# Patient Record
Sex: Male | Born: 2000
Health system: Southern US, Community
[De-identification: ages and names within clinical notes are randomized; demographics above are authoritative.]

## PROBLEM LIST (undated history)

## (undated) DIAGNOSIS — F909 Attention-deficit hyperactivity disorder, unspecified type: Secondary | ICD-10-CM

## (undated) HISTORY — DX: Attention-deficit hyperactivity disorder, unspecified type: F90.9

## (undated) HISTORY — PX: TYMPANOSTOMY TUBE PLACEMENT: SHX32

---
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
## 2022-03-05 ENCOUNTER — Other Ambulatory Visit (HOSPITAL_BASED_OUTPATIENT_CLINIC_OR_DEPARTMENT_OTHER): Payer: Self-pay

## 2023-05-03 DIAGNOSIS — H52223 Regular astigmatism, bilateral: Secondary | ICD-10-CM | POA: Diagnosis not present

## 2023-05-03 DIAGNOSIS — H5213 Myopia, bilateral: Secondary | ICD-10-CM | POA: Diagnosis not present

## 2023-07-24 DIAGNOSIS — R5383 Other fatigue: Secondary | ICD-10-CM | POA: Diagnosis not present

## 2023-08-03 ENCOUNTER — Ambulatory Visit (INDEPENDENT_AMBULATORY_CARE_PROVIDER_SITE_OTHER): Admitting: Psychiatry

## 2023-08-03 ENCOUNTER — Encounter: Payer: Self-pay | Admitting: Psychiatry

## 2023-08-03 ENCOUNTER — Other Ambulatory Visit: Payer: Self-pay

## 2023-08-03 ENCOUNTER — Other Ambulatory Visit (HOSPITAL_COMMUNITY): Payer: Self-pay

## 2023-08-03 VITALS — BP 142/95 | HR 111 | Ht 71.0 in | Wt 230.0 lb

## 2023-08-03 DIAGNOSIS — F331 Major depressive disorder, recurrent, moderate: Secondary | ICD-10-CM

## 2023-08-03 DIAGNOSIS — F411 Generalized anxiety disorder: Secondary | ICD-10-CM

## 2023-08-03 DIAGNOSIS — F900 Attention-deficit hyperactivity disorder, predominantly inattentive type: Secondary | ICD-10-CM | POA: Diagnosis not present

## 2023-08-03 MED ORDER — FLUOXETINE HCL 20 MG PO CAPS
20.0000 mg | ORAL_CAPSULE | Freq: Every day | ORAL | 1 refills | Status: DC
  Filled 2023-08-03: qty 30, 30d supply, fill #0
  Filled 2023-09-03: qty 30, 30d supply, fill #1
  Filled 2023-09-04 (×3): qty 30, 30d supply, fill #0

## 2023-08-03 NOTE — Progress Notes (Signed)
 [facility] [address], [place]   New patient visit Date of Service: 08/03/2023  Referral Source: self History From: patient, chart review    New Patient Appointment    Brian Weaver is a 23 y.o. male with a history significant for anxiety, depression, and ADHD. Patient is currently taking the following medications:  - none _______________________________________________________________  Brian Weaver presents to clinic alone for his visit.  He reports that he first started to experience depression at the end of 9th grade. This has come and gone since that time. He does feel for about 1-1.5 years he has been having more and more symptoms of depression. He reports having a gradually worsening mood, feeling less interested in doing his hobbies and activities, feeling lower energy, wanting to isolate more, feeling bad about himself, having major appetite changes, and struggling with sleep. He denies any events that seemed to cause this. He states that his appetite changes have been intense enough that he lost about 50lbs over a few months due to not eating. He then gained a lot of this weight back. He feels that his mood right now is low and impacts his function. He doesn't feel happy most of the time, and feels that this is actually making him irritable. He has done some therapy without any major benefit. He is interested in medicine.  He reports that he also struggles with worry. He reports that he will get pressure in his chest in uncomfortable situations. He worries a lot about social settings, worries about conflict and being involved in conflict. He feels that he will shake often when upset or anxious. He struggles with sleeping at night due to his mind wandering about stress. He feels on edge often and feels irritable often. He denies having panic attacks but feels he has come close.  He reports previous ADHD diagnosis. He still experiences these symptoms when  he tries to do school work. In his day to day job this doesn't have any major impact as he feels his can focus on his work. He doesn't feel "book work" is for him due to this. He denies any SI/HI/AVH.     Current suicidal/homicidal ideations: denied Current auditory/visual hallucinations: denied Sleep: difficulty falling asleep Appetite: Fluctuating Depression: see HPI Bipolar symptoms: denies ASD: denies Encopresis/Enuresis: denies Tic: denies Generalized Anxiety Disorder: see HPI Other anxiety: denies Obsessions and Compulsions: denies Trauma/Abuse: denies ADHD: see HPI   ROS     Current Outpatient Medications:    FLUoxetine  (PROZAC ) 20 MG capsule, Take 1 capsule (20 mg total) by mouth daily., Disp: 30 capsule, Rfl: 1   No Known Allergies    Psychiatric History: Previous diagnoses/symptoms: ADHD Non-Suicidal Self-Injury: denies Suicide Attempt History: denies Violence History: denies  Current psychiatric provider: denies Psychotherapy: yes Previous psychiatric medication trials:  [name]  Psychiatric hospitalizations: denies History of trauma/abuse: denies    Past Medical History:  Diagnosis Date   ADHD (attention deficit hyperactivity disorder)     History of head trauma? No History of seizures?  No     Substance use reviewed with pt, with pertinent items below: Used THC heavily from May to December 2024. Now drinks alcohol regularly - heavy use on weekends, some use on weekdays at times Vapes nicotine daily History of substance/alcohol abuse treatment: denies     Family psychiatric history: alcohol use disorder in both sides of  his family  Family history of suicide? denies   Current Living Situation (including members of house hold):  lives with parents, two brothers Other family and supports: yes Sexual Activity:  not explored today Legal History:  denies  Religion/Spirituality: not explored today Access to Guns: denies  Works on trees and  in Veterinary surgeon:  reviewed   Mental Status Examination:  Psychiatric Specialty Exam: Blood pressure (!) 142/95, pulse (!) 111, height 5\' 11"  (1.803 m), weight 230 lb (104.3 kg).Body mass index is 32.08 kg/m.  General Appearance: Neat and Well Groomed  Eye Contact:  Good  Speech:  Clear and Coherent  Mood:  Anxious  Affect:  Appropriate and Congruent  Thought Process:  Goal Directed  Orientation:  Full (Time, Place, and Person)  Thought Content:  Logical  Suicidal Thoughts:  No  Homicidal Thoughts:  No  Memory:  Immediate;   Good  Judgement:  Good  Insight:  Good  Psychomotor Activity:  Normal  Concentration:  Concentration: Good  Recall:  Good  Fund of Knowledge:  Good  Language:  Good  Cognition:  WNL     Assessment   Psychiatric Diagnoses:   ICD-10-CM   1. Major depressive disorder, recurrent episode, moderate (HCC)  F33.1     2. Generalized anxiety disorder  F41.1     3. ADHD (attention deficit hyperactivity disorder), inattentive type  F90.0        Medical Diagnoses: Patient Active Problem List   Diagnosis Date Noted   Medication management 11/16/2018   ADHD (attention deficit hyperactivity disorder), inattentive type 07/23/2015   Dysgraphia 07/23/2015     Medical Decision Making: Moderate  Brian Weaver is a 23 y.o. male with a history detailed above.   On evaluation Brian Weaver has symptoms consistent with anxiety, depression, and ADHD. His anxiety and depression both appear to be the most problematic issues at this time. He reports about 1-1.5 years of gradually worsening moods, low energy, anhedonia, low feelings of self worth, poor focus, appetite fluctuations, including losing and gaining  about 50lbs. His anxiety includes constant feelings of nervousness, tightness in his chest, tremors, feeling on edge, extreme irritability at times, poor sleep. I feel that these symptoms do impact his life a significant amount and feel that medicines and therapy  would both be helpful. He does drink a fair amount of alcohol, and is at risk for alcohol use disorder given his family history. Discussed reducing/cutting down on this.  Brian Weaver has a history of ADHD and continues to experience symptoms. 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 He still struggles with reading or trying to stay focused on more mundane tasks however.  No SI/Hi/AVH.  There are no identified acute safety concerns. Continue outpatient level of care.     Plan  Medication management:  - Start Prozac  20mg  daily for anxiety and mood  Labs/Studies:  - reviewed  Additional recommendations:  - Continue with current therapist, Crisis plan reviewed and patient verbally contracts for safety. Go to ED with emergent symptoms or safety concerns, and Risks, benefits, side effects of medications, including any / all black box warnings, discussed with patient, who verbalizes their understanding   Follow Up: Return in 1 month - Call in the interim for any side-effects, decompensation, questions, or problems between now and the next visit.   I have spend 60 minutes reviewing the patients chart, meeting with the patient and family, and reviewing medications and potential side effects for their condition of anxiety, depression, ADHD.  Brian Weaver  Sheria Dills, MD [facility]

## 2023-08-04 ENCOUNTER — Other Ambulatory Visit (HOSPITAL_COMMUNITY): Payer: Self-pay

## 2023-08-23 ENCOUNTER — Ambulatory Visit (INDEPENDENT_AMBULATORY_CARE_PROVIDER_SITE_OTHER): Admitting: Psychiatry

## 2023-08-23 DIAGNOSIS — R69 Illness, unspecified: Secondary | ICD-10-CM

## 2023-08-23 DIAGNOSIS — F411 Generalized anxiety disorder: Secondary | ICD-10-CM | POA: Diagnosis not present

## 2023-08-23 DIAGNOSIS — F900 Attention-deficit hyperactivity disorder, predominantly inattentive type: Secondary | ICD-10-CM | POA: Diagnosis not present

## 2023-08-23 DIAGNOSIS — F331 Major depressive disorder, recurrent, moderate: Secondary | ICD-10-CM

## 2023-08-23 NOTE — Progress Notes (Signed)
 PROBLEM-FOCUSED INITIAL PSYCHOTHERAPY EVALUATION Delora Ferry, PhD LP [facility]  Name: Gearold M Ballester Date: 08/23/2023 Time spent: 55 min MRN: [id number] DOB: March 08, 2001 Guardian/Payee: self  PCP: Lyman Sander, MD Documentation requested on this visit: No  PROBLEM HISTORY Reason for Visit /Presenting Problem:  Chief Complaint  Patient presents with   Establish Care   Anxiety   Depression   Family Problem    Narrative/History of Present Illness [problem, parameters, solutions in practice, personal theories what is going on] 23yo WM noted seen 5/1 for evaluation by Loletta Ripple, MD of this office.  Scheduled with me several weeks earlier, but substantial wait time in my schedule.  Concerns noted in Dr. Evern Hocking evaluation of anxiety, depression, ADHD, and significant risk of substance use disorders involving alcohol, THC, and nicotine vape.  Started at that time on 20mg  Prozac .  For himself, Rakim reports he has some prior meeting with EAP service of M's company, referred him here.  Regards his ADHD as "ADD" actually.  Was on [name]  until 3-4 yrs ago, doesn't really miss it.  Currently working a tree service, feeling no need for medication to fulfill his duties.  Main interest in therapy is to learn better how to deal with a range of stresses, largely including family issues, life changes, and drama in relationships at work and family.  Among other things, he wants to change jobs.    Main "drama" involves GF's F, Tom, who has severe autism, helped him get the job, and serves as his Fish farm manager.  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  He has quit twice before, in fact, and recently moved to part time to diminish the drama.  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  Has been able to ask both management and colleagues to change behavior and communication, but the problem is persistent.    Re family, can get tired of catching complaints about other family members, which really is the theme both at work and in family.  Family structure includes, M, who is a Engineer, structural with [employer].  GF Jess also a rad Engineering geologist.  Gaye Kato can be unassertive sometimes, and needs Romello to stand for her with family disputes or pressures.  While he might rather she could speak more for herself, he is mostly just protective of her and does not want her to have to deal with same triangling habits and expectations.  Living together currently, with intention to establish their own household.  Admittedly had been depressed, largely because he can resent having to field these issues, at least at a low level.  Has come to understand his drinking as self-medicating tension and suppressed anger.  Prozac  is working already, and partly for that success, plus Jess's urging, he has curtailed drinking substantially.     Prior Psychiatric Assessment/Treatment:   Outpatient treatment: brief therapy, past hx stimulant prescription for ADHD Psychiatric hospitalization: none stated Psychological assessment/testing: none stated   Abuse/neglect screening: Victim of abuse: Not assessed at this time / none suspected.   Victim of neglect: Not assessed at  this time / none suspected.   Perpetrator of abuse/neglect: Not assessed at this time / none suspected.   Witness / Exposure to Domestic Violence: Not assessed at this time / none suspected.   Witness to Community Violence:  Not assessed at this time / none suspected.   Protective Services Involvement: No.   Report needed: No.  +  Substance  abuse screening: Current substance abuse: alcohol acknowledged, already much reduced; THC and nicotine not assessed as yet.   History of impactful substance use/abuse: per psychiatry, r/o hx use disordervs excessive self-medication while depressed.  Smoking risk noted in SDOH (daily).   FAMILY/SOCIAL HISTORY Family of origin -- M as noted, remainder deferred Family of intention/current living situation -- with GF and family Education -- h.s. diploma Vocation -- Tree service Finances -- Current income from work Product/process development scientist -- Spiritual/religious identification deferred Enjoyable activities -- deferred Other situational factors affecting treatment and prognosis: Stressors from the following areas: Marital or family conflict and Occupational concerns Barriers to service: availability   Notable cultural sensitivities: not applicable  Strengths: Supportive Relationships, Spirituality, and Able to Communicate Effectively   MED/SURG HISTORY Med/surg history was not reviewed with Pt at this time.  Of note for psychotherapy at this time no concerns. Past Medical History:  Diagnosis Date   ADHD (attention deficit hyperactivity disorder)      Past Surgical History:  Procedure Laterality Date   TYMPANOSTOMY TUBE PLACEMENT      No Known Allergies  Medications (as listed in Epic): Current Outpatient Medications  Medication Sig Dispense Refill   FLUoxetine  (PROZAC ) 20 MG capsule Take 1 capsule (20 mg total) by mouth daily. 30 capsule 1   No current facility-administered medications for this visit.    MENTAL STATUS AND OBSERVATIONS Appearance:   Casual     Behavior:  Appropriate  Motor:  Normal  Speech/Language:   Clear and Coherent  Affect:  Appropriate  Mood:  Appropriate to topic  Thought process:  normal  Thought content:    WNL  Sensory/Perceptual disturbances:    WNL  Orientation:  Fully oriented  Attention:  Good  Concentration:  Good  Memory:  WNL  Fund of knowledge:    Good  Insight:    Good  Judgment:   Good  Impulse Control:  Good   Initial Risk Assessment: Danger to self: No Self-injurious behavior: No Danger to others: No Physical aggression / violence: No Duty to warn: No Access to firearms a concern: No Gang involvement or other high risk behavior: No Patient / guardian was educated about steps to take if suicide or homicide risk level increases between visits: yes While future psychiatric events cannot be accurately predicted, the patient does not currently require acute inpatient psychiatric care and does not currently meet [place]  involuntary commitment criteria.   DIAGNOSIS:    ICD-10-CM   1. Major depressive disorder, recurrent episode, moderate (HCC)  F33.1    improving well    2. Generalized anxiety disorder  F41.1     3. ADHD (attention deficit hyperactivity disorder), inattentive type  F90.0    r/o secondary to emotional issues, r/o learning issue of another type    4. r/o substance use disorder vs. transitional issue coping with depression, anxiety, stress  R69       INITIAL TREATMENT: Support/validation provided for distressing symptoms and confirmed rapport Ethical orientation and informed consent confirmed re: Privacy rights -- including but not limited to HIPAA, EMR and use of e-PHI Working alliance --  expectations for working relationship in psychotherapy, initial orientation to cognitive-behavioral and solution-focused therapy approach Patient responsibilities -- scheduling, fair notice of changes, in-person vs. telehealth and regulatory and financial conditions affecting choice Coordination of care -- needs and consents for working partnerships and exchange of information with other health care providers, especially any medication and other behavioral health providers Outlook for therapy -- scheduling constraints, availability of crisis service, inclusion of family member(s) as appropriate Psychoeducation and  initial recommendations: Discussed comprehension of Tom and teammates, oriented to more constructive communication tactics and assertiveness Endorsed ambitions to promote in tree service and pursue intended trade Educated on alcohol a depressant drug, working against him after it works for him, and endorsed current discipline to keep it moderate and occasional  Plan: Relationship stress -- Use assertiveness skills to request change, even if it is hard to come by, away from triangling him in work or family drama.  Notice effectiveness of asking for change, and refusing to do irrational favors, without having to become angry to make the point.   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  Recommend no THC or tobacco as well. Maintain medication as prescribed and work faithfully with relevant prescriber(s) if any changes are desired or seem indicated Call the clinic on-call service, present to ER, or call 911 if any life-threatening psychiatric crisis Return for recommend 2 wks if able, whenever works if longer, put on [place] list.  Maretta Shaper, PhD  Delora Ferry, PhD LP Clinical Psychologist, [facility] [address], [address] [place], [place] [address] [phone]

## 2023-09-04 ENCOUNTER — Other Ambulatory Visit (HOSPITAL_COMMUNITY): Payer: Self-pay

## 2023-09-05 ENCOUNTER — Other Ambulatory Visit: Payer: Self-pay | Admitting: Psychiatry

## 2023-09-05 ENCOUNTER — Other Ambulatory Visit (HOSPITAL_BASED_OUTPATIENT_CLINIC_OR_DEPARTMENT_OTHER): Payer: Self-pay

## 2023-09-05 ENCOUNTER — Other Ambulatory Visit (HOSPITAL_COMMUNITY): Payer: Self-pay

## 2023-09-06 ENCOUNTER — Other Ambulatory Visit (HOSPITAL_COMMUNITY): Payer: Self-pay

## 2023-09-07 ENCOUNTER — Ambulatory Visit: Admitting: Psychiatry

## 2023-09-07 ENCOUNTER — Encounter: Payer: Self-pay | Admitting: Psychiatry

## 2023-09-07 ENCOUNTER — Other Ambulatory Visit (HOSPITAL_COMMUNITY): Payer: Self-pay

## 2023-09-07 DIAGNOSIS — F900 Attention-deficit hyperactivity disorder, predominantly inattentive type: Secondary | ICD-10-CM | POA: Diagnosis not present

## 2023-09-07 DIAGNOSIS — F331 Major depressive disorder, recurrent, moderate: Secondary | ICD-10-CM | POA: Diagnosis not present

## 2023-09-07 DIAGNOSIS — F411 Generalized anxiety disorder: Secondary | ICD-10-CM | POA: Diagnosis not present

## 2023-09-07 MED ORDER — FLUOXETINE HCL 20 MG PO CAPS
20.0000 mg | ORAL_CAPSULE | Freq: Every day | ORAL | 1 refills | Status: DC
  Filled 2023-09-07 – 2023-10-05 (×2): qty 90, 90d supply, fill #0
  Filled 2024-01-08 – 2024-01-16 (×3): qty 90, 90d supply, fill #1

## 2023-09-07 NOTE — Progress Notes (Signed)
 [facility] [address], [place] Brian Weaver   Follow-up visit  Date of Service: 09/07/2023  CC/Purpose: Routine medication management follow up.    Brian Weaver is a 23 y.o. male with a past psychiatric history of depression, anxiety who presents today for a psychiatric follow up appointment.    The patient was last seen on 08/03/23, at which time the following plan was established:  Medication management:             - Start Prozac  20mg  daily for anxiety and mood _______________________________________________________________________________________ Acute events/encounters since last visit: none    Brian Weaver presents to clinic alone. He reports that things have been going better lately. He has still been working, and feels this has been going pretty well lately. He states that the medicine seems to be helping. He doesn't feel down when he wakes up, and doesn't feel like dying after work. He is happy with how the medicine is doing at this time and has no complaints today. No SI/Hi/AVH.    Sleep: stable Appetite: Stable Depression: denies Bipolar symptoms:  denies Current suicidal/homicidal ideations:  denied Current auditory/visual hallucinations:  denied    Non-Suicidal Self-Injury: denies Suicide Attempt History: denies  Psychotherapy: yes Previous psychiatric medication trials:  [name]     Works on trees and in Scientist, physiological  Current Living Situation (including members of house hold): lives with parents, two brothers     No Known Allergies    Labs:  reviewed  Medical diagnoses: Patient Active Problem List   Diagnosis Date Noted   Medication management 11/16/2018   ADHD (attention deficit hyperactivity disorder), inattentive type 07/23/2015   Dysgraphia 07/23/2015    Psychiatric Specialty Exam: There were no vitals taken for this visit.There is no height or weight on file to calculate BMI.  General Appearance: Neat and Well Groomed  Eye  Contact:  Good  Speech:  Clear and Coherent and Normal Rate  Mood:  Euthymic  Affect:  Appropriate  Thought Process:  Goal Directed  Orientation:  Full (Time, Place, and Person)  Thought Content:  Logical  Suicidal Thoughts:  No  Homicidal Thoughts:  No  Memory:  Immediate;   Good  Judgement:  Good  Insight:  Good  Psychomotor Activity:  Normal  Concentration:  Concentration: Good  Recall:  Good  Fund of Knowledge:  Good  Language:  Good  Assets:  Communication Skills Desire for Improvement Financial Resources/Insurance Housing Leisure Time Physical Health Resilience Social Support Talents/Skills Transportation Vocational/Educational  Cognition:  WNL      Assessment   Psychiatric Diagnoses:   ICD-10-CM   1. Major depressive disorder, recurrent episode, moderate (HCC)  F33.1     2. Generalized anxiety disorder  F41.1     3. ADHD (attention deficit hyperactivity disorder), inattentive type  F90.0       Patient complexity: Moderate   Patient Education and Counseling:  Supportive therapy provided for identified psychosocial stressors.  Medication education provided and decisions regarding medication regimen discussed with patient/guardian.   On assessment today, Brian Weaver has responded well to Prozac . This appears to have provided benefit to his mood and his anxiety. He has also started therapy, and has cut back some on his substance use. I feel this multimodal approach will continue to provide benefit moving forward. We will not adjust his regimen at this time. No SI/HI/AVH.    Plan  Medication management:  - Continue Prozac  20mg  daily for depression  Labs/Studies:  - none today  Additional  recommendations:  - Continue with current therapist, Crisis plan reviewed and patient verbally contracts for safety. Go to ED with emergent symptoms or safety concerns, and Risks, benefits, side effects of medications, including any / all black box warnings, discussed with  patient, who verbalizes their understanding   Follow Up: Return in 2 months - Call in the interim for any side-effects, decompensation, questions, or problems between now and the next visit.   I have spent 20 minutes reviewing the patients chart, meeting with the patient and family, and reviewing medicines and side effects.   Brian Base, MD [facility]

## 2023-10-02 ENCOUNTER — Ambulatory Visit (INDEPENDENT_AMBULATORY_CARE_PROVIDER_SITE_OTHER): Admitting: Psychiatry

## 2023-10-02 DIAGNOSIS — F331 Major depressive disorder, recurrent, moderate: Secondary | ICD-10-CM

## 2023-10-02 DIAGNOSIS — R69 Illness, unspecified: Secondary | ICD-10-CM

## 2023-10-02 DIAGNOSIS — F411 Generalized anxiety disorder: Secondary | ICD-10-CM

## 2023-10-02 DIAGNOSIS — F900 Attention-deficit hyperactivity disorder, predominantly inattentive type: Secondary | ICD-10-CM

## 2023-10-02 NOTE — Progress Notes (Signed)
 Psychotherapy Progress Note [facility] Brian Kendall, PhD LP  Patient ID: Brian Weaver (Brian Weaver)    MRN: [id number] Therapy format: Individual psychotherapy Date: 10/02/2023      Start: 4:16p     Stop: 5:03p     Time Spent: 47 min Location: In-person   Session narrative (presenting needs, interim history, self-report of stressors and symptoms, applications of prior therapy, status changes, and interventions made in session) 2nd visit for therapy.  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  The tree company is headed by a man [name] who had a stroke last year, and tensions rise and fall with him and his wife [name].  Got a call from [name] one day asking [name] to call his attorney for him, another one later saying he'd bought a car and he's calling the law because his old lady is after him.  Clearly altered states, so responsibly got in touch with [name].  Next day [name] calls, seemingly in his right mind, to get help learning to work a Research officer, political party, which is probably risky in his condition.  [name] has queried [name] about what he tells him, revealed how he's been spending money too freely, and says the company is at risk, wants him to inform on [name] regularly.  Discussed dilemma between conviction to help prevent harm and not wanting to get drawn into family business drama.    Experimenting with trades as well, beyond the possibility of  promoting to climber.  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    F/u on alcohol, has cut down.  Last week he had 1 most every night, only 1 recent bender.  Reports his family is full of drinkers.  Encouraged to maintain reduced drinking , make sure to use other ways to cope, not resort to intoxication to solve emotional strains, let metabolism and physiology have breaks from alcohol for optimal  mood and energy.  Stress experience with family communication.  Parents have a habit of bringing up important conversations at least available times and pressuring him about making choices or to take paths they see fit.  Seem to be afraid he will fail somehow if he doesn't do what they think is right for him.  Have pitched going to college, but leery of investing that much money and time it won't translate to a lucrative career and only saddle him with debt, as he's herd form others.  Validated it's not a panacea and coached in assertiveness with parents, e.g., asking What are you afraid will happen? and how would you tell if it's OK to let me figure it out for myself?  Therapeutic modalities: Cognitive Behavioral Therapy, Solution-Oriented/Positive Psychology, Ego-Supportive, and Psycho-education/Bibliotherapy  Mental Status/Observations:  Appearance:   Casual     Behavior:  Appropriate  Motor:  Normal  Speech/Language:   Clear and Coherent  Affect:  Appropriate  Mood:  Tense with subject  Thought process:  normal  Thought content:    WNL  Sensory/Perceptual disturbances:    WNL  Orientation:  Fully oriented  Attention:  Good    Concentration:  Good  Memory:  WNL  Insight:    Good  Judgment:   Good  Impulse Control:  Good   Risk Assessment: Danger to Self: No Self-injurious Behavior: No Danger to Others: No Physical Aggression / Violence: No  Duty to Warn: No Access to Firearms a concern: No  Assessment of progress:  progressing well  Diagnosis:   ICD-10-CM   1. Major depressive disorder, recurrent episode, moderate (HCC)  F33.1     2. Generalized anxiety disorder  F41.1     3. ADHD (attention deficit hyperactivity disorder), inattentive type  F90.0     4. r/o substance use disorder vs. transitional issue coping with depression, anxiety, stress  R69      Plan:  Relationship stress -- Use assertiveness skills to request change, even if it is hard to come by, away from  triangling him in work or family drama.  Notice effectiveness of asking for change, and refusing to do irrational favors, without having to become angry to make the point.  Use assertive questions to buffer uninvited advice and pressure to choose. 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  Also endorse exploring other trades at interest.  Concur that college expense may be premature and contraindicated at this time, could be worth stabilizing income and lifestyle before deciding whether he needs to go beyond enjoyed trades. Self-medication -- Continue to minimize alcohol use and keep down times of substantial intoxication.  Recommend no THC or tobacco as well. Other recommendations/advice -- As may be noted above.  Continue to utilize previously learned skills ad lib. Medication compliance -- Maintain medication as prescribed and work faithfully with relevant prescriber(s) if any changes are desired or seem indicated. Crisis service -- Aware of call list and work-in appts.  Call the clinic on-call service, 988/hotline, 911, or present to [facility] or ER if any life-threatening psychiatric crisis. Followup -- Return for time as available.  Next scheduled visit with me Visit date not found.  Next scheduled in this office 11/07/2023.  Lamar Kendall, PhD Brian Kendall, PhD LP Clinical Psychologist, [facility] [address] [place], [place] [address] [phone]

## 2023-10-07 ENCOUNTER — Other Ambulatory Visit (HOSPITAL_COMMUNITY): Payer: Self-pay

## 2023-10-09 ENCOUNTER — Other Ambulatory Visit (HOSPITAL_COMMUNITY): Payer: Self-pay

## 2023-10-10 ENCOUNTER — Other Ambulatory Visit: Payer: Self-pay

## 2023-10-10 ENCOUNTER — Other Ambulatory Visit (HOSPITAL_COMMUNITY): Payer: Self-pay

## 2023-10-11 ENCOUNTER — Other Ambulatory Visit: Payer: Self-pay

## 2023-11-02 ENCOUNTER — Other Ambulatory Visit (HOSPITAL_BASED_OUTPATIENT_CLINIC_OR_DEPARTMENT_OTHER): Payer: Self-pay

## 2023-11-07 ENCOUNTER — Ambulatory Visit: Admitting: Psychiatry

## 2023-11-14 ENCOUNTER — Other Ambulatory Visit (HOSPITAL_COMMUNITY): Payer: Self-pay

## 2023-11-14 ENCOUNTER — Telehealth (INDEPENDENT_AMBULATORY_CARE_PROVIDER_SITE_OTHER): Admitting: Psychiatry

## 2023-11-14 DIAGNOSIS — F411 Generalized anxiety disorder: Secondary | ICD-10-CM | POA: Diagnosis not present

## 2023-11-14 DIAGNOSIS — F331 Major depressive disorder, recurrent, moderate: Secondary | ICD-10-CM

## 2023-11-14 DIAGNOSIS — F900 Attention-deficit hyperactivity disorder, predominantly inattentive type: Secondary | ICD-10-CM | POA: Diagnosis not present

## 2023-11-14 MED ORDER — FLUOXETINE HCL 10 MG PO CAPS
10.0000 mg | ORAL_CAPSULE | Freq: Every day | ORAL | 1 refills | Status: AC
  Filled 2023-11-14: qty 90, 90d supply, fill #0

## 2023-11-15 ENCOUNTER — Encounter: Payer: Self-pay | Admitting: Psychiatry

## 2023-11-15 NOTE — Progress Notes (Signed)
 [facility] [address], [place]   Follow-up visit  Date of Service: 11/14/2023  CC/Purpose: Routine medication management follow up.   Virtual Visit via Video Note  I connected with pt @ on 11/14/2023 at  2:30 PM EDT by a video enabled telemedicine application and verified that I am speaking with the correct person using two identifiers.   I discussed the limitations of evaluation and management by telemedicine and the availability of in person appointments. The patient expressed understanding and agreed to proceed.  I discussed the assessment and treatment plan with the patient. The patient was provided an opportunity to ask questions and all were answered. The patient agreed with the plan and demonstrated an understanding of the instructions.   The patient was advised to call back or seek an in-person evaluation if the symptoms worsen or if the condition fails to improve as anticipated.  I provided 25 minutes of non-face-to-face time during this encounter.  The patient was located at home.  The provider was located at [facility].   Selinda GORMAN Lauth, MD    Brian Weaver is a 23 y.o. male with a past psychiatric history of depression, anxiety who presents today for a psychiatric follow up appointment.    The patient was last seen on 09/07/23, at which time the following plan was established:  Medication management:             - Continue Prozac  20mg  daily for depression ___________________________________________ Acute events/encounters since last visit: none    Brian Weaver presents via video. He reports that he has been taking his medicine daily. He feels that it has been a helpful medicine for him. He continues to feel his anxiety and mood are doing better. He would like to try a higher dose to see if it would help a little more. He has no other concerns today. No SI/Hi/AVH.    Sleep: stable Appetite: Stable Depression:  denies Bipolar symptoms:  denies Current suicidal/homicidal ideations:  denied Current auditory/visual hallucinations:  denied    Non-Suicidal Self-Injury: denies Suicide Attempt History: denies  Psychotherapy: yes Previous psychiatric medication trials:  [name]     Works on trees and in Scientist, physiological  Current Living Situation (including members of house hold): lives with parents, two brothers     No Known Allergies    Labs:  reviewed  Medical diagnoses: Patient Active Problem List   Diagnosis Date Noted   Medication management 11/16/2018   ADHD (attention deficit hyperactivity disorder), inattentive type 07/23/2015   Dysgraphia 07/23/2015    Psychiatric Specialty Exam: There were no vitals taken for this visit.There is no height or weight on file to calculate BMI.  General Appearance: Neat and Well Groomed  Eye Contact:  Good  Speech:  Clear and Coherent and Normal Rate  Mood:  Euthymic  Affect:  Appropriate  Thought Process:  Goal Directed  Orientation:  Full (Time, Place, and Person)  Thought Content:  Logical  Suicidal Thoughts:  No  Homicidal Thoughts:  No  Memory:  Immediate;   Good  Judgement:  Good  Insight:  Good  Psychomotor Activity:  Normal  Concentration:  Concentration: Good  Recall:  Good  Fund of Knowledge:  Good  Language:  Good  Assets:  Communication Skills Desire for Improvement Financial Resources/Insurance Housing Leisure Time Physical Health Resilience Social Support Talents/Skills Transportation Vocational/Educational  Cognition:  WNL      Assessment   Psychiatric Diagnoses:   ICD-10-CM   1. Major  depressive disorder, recurrent episode, moderate (HCC)  F33.1     2. Generalized anxiety disorder  F41.1     3. ADHD (attention deficit hyperactivity disorder), inattentive type  F90.0        Patient complexity: Moderate   Patient Education and Counseling:  Supportive therapy provided for identified psychosocial  stressors.  Medication education provided and decisions regarding medication regimen discussed with patient/guardian.   On assessment today, Brian Weaver has responded well to Prozac . He continues to feel that this has provided benefit. We will plan on raising this dose per his preference and will monitor his response. No SI/HI/AVH.    Plan  Medication management:  - Increase Prozac  to 30mg  daily for depression  Labs/Studies:  - none today  Additional recommendations:  - Continue with current therapist, Crisis plan reviewed and patient verbally contracts for safety. Go to ED with emergent symptoms or safety concerns, and Risks, benefits, side effects of medications, including any / all black box warnings, discussed with patient, who verbalizes their understanding   Follow Up: Return in 2 months - Call in the interim for any side-effects, decompensation, questions, or problems between now and the next visit.   I have spent 25 minutes reviewing the patients chart, meeting with the patient and family, and reviewing medicines and side effects.   Selinda GORMAN Lauth, MD [facility]

## 2023-11-23 ENCOUNTER — Other Ambulatory Visit (HOSPITAL_COMMUNITY): Payer: Self-pay

## 2023-11-27 ENCOUNTER — Ambulatory Visit: Admitting: Psychiatry

## 2024-01-03 ENCOUNTER — Ambulatory Visit (INDEPENDENT_AMBULATORY_CARE_PROVIDER_SITE_OTHER): Admitting: Psychiatry

## 2024-01-03 DIAGNOSIS — R69 Illness, unspecified: Secondary | ICD-10-CM

## 2024-01-03 DIAGNOSIS — F900 Attention-deficit hyperactivity disorder, predominantly inattentive type: Secondary | ICD-10-CM

## 2024-01-03 DIAGNOSIS — F411 Generalized anxiety disorder: Secondary | ICD-10-CM | POA: Diagnosis not present

## 2024-01-03 DIAGNOSIS — F331 Major depressive disorder, recurrent, moderate: Secondary | ICD-10-CM

## 2024-01-03 NOTE — Progress Notes (Signed)
 Psychotherapy Progress Note [facility] Jodie Kendall, PhD LP  Patient ID: Brian Weaver ([name])    MRN: [id number] Therapy format: Individual psychotherapy Date: 01/03/2024      Start: 6:03p     Stop: 6:53p     Time Spent: 50 min Location: In-person   Session narrative (presenting needs, interim history, self-report of stressors and symptoms, applications of prior therapy, status changes, and interventions made in session) 3 mos since last seen, 7 weeks since last med check.  Requested increase in Prozac  8/12 but hasn't picked it up yet for working long hours continuously and being unable .  Hopeful he can pick up on Friday, taking a personal errand day.    In work, is working up his skills and capacity to work independently, with pay raise coming.  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  Practicing serenity in the face of unfair language most of the time and continues learning trades and management skills enough to promote in pay and maybe position for his own business someday.  Meanwhile, consumed with how parents are upset with him just last night, and continuing into this morning (M sending a long talk-to-text) over him saying something rude to his brother last night, while under some influence, with allegedly dramatized concern for his use of vape and alcohol.  On the whole, still temperate most of the time with alcohol, and last night was not actually hostile, just provoked by brother, who has mild ASD and was trying to get the hang of verbal sparring like normal guys and was not himself offended.  Discussed at length how to understand his parents and respond constructively, splitting the difference between risking seeming insolent to them and pressurizing himself with intolerable silence-keeping.   Coached in listening for a parent's need to know they got listened to, and seeking a pause to feed back the message he got, without reaction or judgment back, and after that having leverage to say he heard them.  Beyond that, license to point out that they are on the same side -- he wants to kick the habit, too -- and he can do better about that if she/they will lower the pressure because it backfires, sparking his own resistance.  He can try to manage that, too, but they can make it easier to get what they themselves want if they will dial back and let him know they believe he can, not just that he should.  Very grateful for the insight, ready to try it out.  Offered to call back for a brief check in before next appointment if he finds it helps.    Therapeutic modalities: Cognitive Behavioral Therapy, Solution-Oriented/Positive Psychology, and Assertiveness/Communication  Mental Status/Observations:  Appearance:   Casual     Behavior:  Appropriate  Motor:  Normal  Speech/Language:   Clear and Coherent  Affect:  Appropriate  Mood:  dysthymic  Thought process:  normal  Thought content:    WNL  Sensory/Perceptual disturbances:    WNL  Orientation:  Fully oriented  Attention:  Good    Concentration:  Good  Memory:  WNL  Insight:    Good  Judgment:   Good  Impulse Control:  Variable   Risk Assessment: Danger to Self: No Self-injurious Behavior: No Danger to Others: No  Physical Aggression / Violence: No Duty to Warn: No Access to Firearms a concern: No  Assessment of progress:  progressing  Diagnosis:   ICD-10-CM   1. Major depressive disorder, recurrent episode, moderate (HCC)  F33.1     2. Generalized anxiety disorder  F41.1     3. ADHD (attention deficit hyperactivity disorder), inattentive type  F90.0     4. r/o substance use disorder vs. transitional issue coping with depression, anxiety, stress  R69      Plan:  Family/work relationship stress -- Use assertiveness skills to  request change, even if it is hard to come by, away from triangling him in work or family drama.  Notice effectiveness of asking for change, and refusing to do irrational favors, without having to become angry to make the point.  Use assertive questions to buffer uninvited advice and pressure to choose when unready.  Particular recommendation to meet parental pointedness with active listening and assertive inquiry, and to be ready to point out calmly that extra pressure creates resistance, ask for dialing back (and then take care of it himself neutralizing resistance -- doing the better thing for his own good reasons, no matter what treatment he gets, is personal victory. 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  Also endorse exploring other trades at interest.  Concur that college expense may be premature and contraindicated at this time, could be worth stabilizing income and lifestyle before deciding whether he needs to go beyond enjoyed trades. Self-medication -- Continue to minimize alcohol use and keep down times of substantial intoxication.  Recommend no THC use, and drive down nicotine vape as well. Other recommendations/advice -- As may be noted above.  Continue to utilize previously learned skills ad lib. Medication compliance -- Maintain medication as prescribed and work faithfully with relevant prescriber(s) if any changes are desired or seem indicated. Crisis service -- Aware of call list and work-in appts.  Call the clinic on-call service, 988/hotline, 911, or present to [facility] or ER if any life-threatening psychiatric crisis. Followup -- Return for time as already scheduled.  Next scheduled visit with me 01/31/2024.  Next scheduled in this office 01/29/2024.  Lamar Kendall, PhD Jodie Kendall, PhD LP Clinical Psychologist, [facility] [address] [place], [place] [address] [phone]

## 2024-01-09 ENCOUNTER — Other Ambulatory Visit: Payer: Self-pay

## 2024-01-09 ENCOUNTER — Encounter: Payer: Self-pay | Admitting: Pharmacist

## 2024-01-12 ENCOUNTER — Other Ambulatory Visit: Payer: Self-pay

## 2024-01-15 ENCOUNTER — Ambulatory Visit: Admitting: Psychiatry

## 2024-01-16 ENCOUNTER — Other Ambulatory Visit (HOSPITAL_COMMUNITY): Payer: Self-pay

## 2024-01-17 ENCOUNTER — Other Ambulatory Visit: Payer: Self-pay

## 2024-01-17 ENCOUNTER — Other Ambulatory Visit (HOSPITAL_COMMUNITY): Payer: Self-pay

## 2024-01-29 ENCOUNTER — Ambulatory Visit: Admitting: Psychiatry

## 2024-01-31 ENCOUNTER — Ambulatory Visit: Admitting: Psychiatry

## 2024-01-31 DIAGNOSIS — F411 Generalized anxiety disorder: Secondary | ICD-10-CM | POA: Diagnosis not present

## 2024-01-31 DIAGNOSIS — F900 Attention-deficit hyperactivity disorder, predominantly inattentive type: Secondary | ICD-10-CM | POA: Diagnosis not present

## 2024-01-31 DIAGNOSIS — F331 Major depressive disorder, recurrent, moderate: Secondary | ICD-10-CM

## 2024-01-31 DIAGNOSIS — R69 Illness, unspecified: Secondary | ICD-10-CM

## 2024-01-31 NOTE — Progress Notes (Unsigned)
 Psychotherapy Progress Note [facility] 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  Saw the handwriting, quit that job, glad to be out from under getting cussed out and then cheated.  Inconvenient that he's a family friend and A has been trying to find out what;s wrong and work out some kind of reconciling.  Father has been validating, and represents him to A that Brian Weaver dug his own hole with unfair treatment.  Turns out other employees quit after him, and neighbors don't like the guy.  Back with the tree company again, working out well so far.  Whole new appreciation for the quirks of who he works with now, in contrat to the spite he had to take.    Meds consistent.  Staying at 20mg .  Messaged psychaitry, whom he missed twice recently.  Relationship steady with GF.  Relationships with parents better, Mom especially doing better with not repeating, Dad still lapses.  Is ready to be able to say more pestering means more resistance, I have already heard you.    Therapeutic modalities: {AM:23362::Cognitive Behavioral Therapy,Solution-Oriented/Positive Psychology}  Mental Status/Observations:  Appearance:   {PSY:22683}     Behavior:  {PSY:21022743}  Motor:  {PSY:22302}  Speech/Language:   {PSY:22685}  Affect:  {PSY:22687}  Mood:  {PSY:31886}  Thought process:  {PSY:31888}  Thought content:    {PSY:[id number]}  Sensory/Perceptual  disturbances:    {PSY:[id number]}  Orientation:  {Psych Orientation:23301::Fully oriented}  Attention:  {Good-Fair-Poor ratings:23770::Good}    Concentration:  {Good-Fair-Poor ratings:23770::Good}  Memory:  {PSY:254-719-6437}  Insight:    {Good-Fair-Poor ratings:23770::Good}  Judgment:   {Good-Fair-Poor ratings:23770::Good}  Impulse Control:  {Good-Fair-Poor ratings:23770::Good}   Risk Assessment: Danger to Self: {Risk:22599::No} Self-injurious Behavior: {Risk:22599::No} Danger to Others: {Risk:22599::No} Physical Aggression / Violence: {Risk:22599::No} Duty to Warn: {AMYesNo:22526::No} Access to Firearms a concern: {AMYesNo:22526::No}  Assessment of progress:  {Progress:22147::progressing}  Diagnosis: No diagnosis found. Plan:  *** Other recommendations/advice -- As may be noted above.  Continue to utilize previously learned skills ad lib. Medication compliance -- Maintain medication as prescribed and work faithfully with relevant prescriber(s) if any changes are desired or seem indicated. Crisis service -- Aware of call list and work-in appts.  Call the clinic on-call service, 988/hotline, 911, or present to [facility] or ER if any life-threatening psychiatric crisis. Followup -- No follow-ups on file.  Next scheduled visit with me Visit date not found.  Next scheduled in this office Visit date not found.  Brian Kendall, PhD Brian Kendall, PhD LP Clinical Psychologist, [facility] [address], [address] [place], [place] [address] [phone]

## 2024-04-05 ENCOUNTER — Ambulatory Visit (INDEPENDENT_AMBULATORY_CARE_PROVIDER_SITE_OTHER): Admitting: Psychiatry

## 2024-04-05 DIAGNOSIS — F331 Major depressive disorder, recurrent, moderate: Secondary | ICD-10-CM

## 2024-04-05 DIAGNOSIS — F900 Attention-deficit hyperactivity disorder, predominantly inattentive type: Secondary | ICD-10-CM | POA: Diagnosis not present

## 2024-04-05 DIAGNOSIS — F411 Generalized anxiety disorder: Secondary | ICD-10-CM | POA: Diagnosis not present

## 2024-04-05 NOTE — Progress Notes (Signed)
 Psychotherapy Progress Note [facility] Jodie Kendall, PhD LP  Patient ID: Brian Weaver ([place])    MRN: [id number] Therapy format: Individual psychotherapy Date: 04/05/2024      Start: 2:04p     Stop: 2:52p     Time Spent: 48 min Location: In-person   Session narrative (presenting needs, interim history, self-report of stressors and symptoms, applications of prior therapy, status changes, and interventions made in session) Job stable with tree service.  Sees himself saving money, despite M telling him she told him to.  No problem drinking, though still will drink some.  Feels he's mastered the happy medium, and shown parents he'll call if impaired, and he's not taking significant risks.    Had a buddy get killed over [place] in a DWI (other driver).  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  GF's dad [name] tried to hide fence damage at a customer's house.  Brian Weaver has volunteered some inventory of maintenance needs to help out, but is holding the line against spying or being assigned unreasonable duties.  Mood holding, says he's not particularly depressed, though he will feel it more if he misses a couple days of his antidepressant.  Still sticking with 20mg , not 30mg .  Reviewed and found he is not scheduled with psychiatry.  Advised to get in touch, and meanwhile messaged psychiatrist on his behalf to apprise that he is optioned to stay with 20mg  but will need to RS soon.  PHQ-9 = 8, but the concentration issue is probably better understood as ADHD, adjusted PHQ score 6.  Family environment steady, no big issues with parents, though he will get minor lectures and have to fend them off.  No outrage or risk of broken relationships.  Feels up to spending time at his house, no need to be away, and rates himself pretty competent 75-80% at  how to respond to irritating behavior, 85-90% solid about managing his own irritability.  GF can be wishy washy sometimes and irritate him being indirect.  Briefly discussed communications and toning down lingering harshness with her.  She lives at his house as a refugee from dysfunctional family environment with her parents, apparently with collective blessing of parents.  Overall, agreed he's doing well, wants to stretch out time to next therapy and review.  OK'd for 3 months, confirmed he knows how to schedule earlier if needed, and encouraged in current developments.  Therapeutic modalities: Cognitive Behavioral Therapy, Solution-Oriented/Positive Psychology, and Ego-Supportive  Mental Status/Observations:  Appearance:   Casual     Behavior:  Appropriate  Motor:  Normal  Speech/Language:   Clear and Coherent  Affect:  Appropriate  Mood:  dysthymic and mildly  Thought process:  normal  Thought content:    WNL  Sensory/Perceptual disturbances:    WNL  Orientation:  Fully oriented  Attention:  Good    Concentration:  Good  Memory:  WNL  Insight:    Good  Judgment:   Good  Impulse Control:  Variable   Risk Assessment: Danger to Self: No Self-injurious Behavior: No Danger to Others: No Physical Aggression / Violence: No Duty to Warn: No Access to Firearms a concern: No  Assessment of progress:  progressing well  Diagnosis:   ICD-10-CM   1. Major depressive disorder, recurrent episode, moderate (HCC)  F33.1     2. Generalized  anxiety disorder  F41.1     3. ADHD (attention deficit hyperactivity disorder), inattentive type  F90.0      Plan:  Family/work relationship stress -- Use assertiveness skills to request change, even if it is hard to come by, away from triangling him in work or family drama.  Notice effectiveness of asking for change, and refusing to do irrational favors, without having to become angry to make the point.  Use assertive questions to buffer uninvited  advice and pressure to choose when unready.  Particular recommendation to meet parental pointedness with active listening and assertive inquiry, and to be ready to point out calmly that extra pressure creates resistance, ask for dialing back (and then take care of it himself neutralizing resistance -- doing the better thing for his own good reasons, no matter what treatment he gets, is personal victory. 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  Concur that college expense may be premature and contraindicated at this time, could be worth stabilizing income and lifestyle before deciding whether he needs to go beyond enjoyed trades. Self-medication -- Continue to minimize alcohol use and keep down times of substantial intoxication.  Recommend no THC use, and drive down nicotine vape as well. Other recommendations/advice -- As may be noted above.  Continue to utilize previously learned skills ad lib. Medication compliance -- Maintain medication as prescribed and work faithfully with relevant prescriber(s) if any changes are desired or seem indicated. Crisis service -- Aware of call list and work-in appts.  Call the clinic on-call service, 988/hotline, 911, or present to [facility] or ER if any life-threatening psychiatric crisis. Followup -- Return in about 3 months (around 07/04/2024) for needs reschedule with prescriber.  Next scheduled visit with me Visit date not found.  Next scheduled in this office Visit date not found.  Lamar Kendall, PhD Jodie Kendall, PhD LP Clinical Psychologist, [facility] [address] [place], [place] [address] [phone]

## 2024-04-15 ENCOUNTER — Other Ambulatory Visit: Payer: Self-pay | Admitting: Psychiatry

## 2024-04-18 ENCOUNTER — Other Ambulatory Visit (HOSPITAL_BASED_OUTPATIENT_CLINIC_OR_DEPARTMENT_OTHER): Payer: Self-pay

## 2024-04-18 ENCOUNTER — Other Ambulatory Visit: Payer: Self-pay

## 2024-04-18 MED ORDER — FLUOXETINE HCL 20 MG PO CAPS
20.0000 mg | ORAL_CAPSULE | Freq: Every day | ORAL | 0 refills | Status: AC
  Filled 2024-04-18: qty 30, 30d supply, fill #0

## 2024-07-03 ENCOUNTER — Ambulatory Visit: Admitting: Psychiatry

## 2024-07-04 ENCOUNTER — Ambulatory Visit: Admitting: Psychiatry
